# Patient Record
Sex: Female | Born: 1965 | Race: White | Hispanic: No | Marital: Single | Smoking: Current every day smoker
Health system: Southern US, Community
[De-identification: ages and names within clinical notes are randomized; demographics above are authoritative.]

## PROBLEM LIST (undated history)

## (undated) ENCOUNTER — Emergency Department (HOSPITAL_COMMUNITY): Payer: BC Managed Care – PPO

## (undated) HISTORY — PX: CHOLECYSTECTOMY: SHX55

---

## 1998-08-23 ENCOUNTER — Ambulatory Visit (HOSPITAL_COMMUNITY): Admission: RE | Admit: 1998-08-23 | Discharge: 1998-08-24 | Payer: Self-pay | Admitting: *Deleted

## 2001-05-11 ENCOUNTER — Other Ambulatory Visit: Admission: RE | Admit: 2001-05-11 | Discharge: 2001-05-11 | Payer: Self-pay | Admitting: Family Medicine

## 2014-10-20 ENCOUNTER — Emergency Department (INDEPENDENT_AMBULATORY_CARE_PROVIDER_SITE_OTHER): Payer: BC Managed Care – PPO

## 2014-10-20 ENCOUNTER — Encounter (HOSPITAL_COMMUNITY): Payer: Self-pay | Admitting: *Deleted

## 2014-10-20 ENCOUNTER — Emergency Department (HOSPITAL_COMMUNITY)
Admission: EM | Admit: 2014-10-20 | Discharge: 2014-10-20 | Disposition: A | Discharge status: Home / Self Care | Payer: BC Managed Care – PPO | Source: Home / Self Care | Attending: Family Medicine | Admitting: Family Medicine

## 2014-10-20 ENCOUNTER — Emergency Department (HOSPITAL_COMMUNITY)
Admission: EM | Admit: 2014-10-20 | Discharge: 2014-10-20 | Disposition: A | Discharge status: Home / Self Care | Payer: BC Managed Care – PPO | Attending: Emergency Medicine | Admitting: Emergency Medicine

## 2014-10-20 DIAGNOSIS — L03116 Cellulitis of left lower limb: Secondary | ICD-10-CM

## 2014-10-20 DIAGNOSIS — L089 Local infection of the skin and subcutaneous tissue, unspecified: Secondary | ICD-10-CM

## 2014-10-20 DIAGNOSIS — R000 Tachycardia, unspecified: Secondary | ICD-10-CM | POA: Diagnosis not present

## 2014-10-20 DIAGNOSIS — Z720 Tobacco use: Secondary | ICD-10-CM | POA: Diagnosis not present

## 2014-10-20 DIAGNOSIS — M79662 Pain in left lower leg: Secondary | ICD-10-CM | POA: Diagnosis present

## 2014-10-20 LAB — I-STAT CHEM 8, ED
BUN: 9 mg/dL (ref 6–23)
CHLORIDE: 103 meq/L (ref 96–112)
Calcium, Ion: 1.14 mmol/L (ref 1.12–1.23)
Creatinine, Ser: 0.7 mg/dL (ref 0.50–1.10)
Glucose, Bld: 98 mg/dL (ref 70–99)
HEMATOCRIT: 50 % — AB (ref 36.0–46.0)
Hemoglobin: 17 g/dL — ABNORMAL HIGH (ref 12.0–15.0)
POTASSIUM: 3.9 meq/L (ref 3.7–5.3)
Sodium: 139 mEq/L (ref 137–147)
TCO2: 24 mmol/L (ref 0–100)

## 2014-10-20 LAB — CBC WITH DIFFERENTIAL/PLATELET
BASOS ABS: 0 10*3/uL (ref 0.0–0.1)
BASOS PCT: 0 % (ref 0–1)
Eosinophils Absolute: 0 10*3/uL (ref 0.0–0.7)
Eosinophils Relative: 0 % (ref 0–5)
HCT: 45.1 % (ref 36.0–46.0)
Hemoglobin: 15.1 g/dL — ABNORMAL HIGH (ref 12.0–15.0)
Lymphocytes Relative: 14 % (ref 12–46)
Lymphs Abs: 1.8 10*3/uL (ref 0.7–4.0)
MCH: 29.8 pg (ref 26.0–34.0)
MCHC: 33.5 g/dL (ref 30.0–36.0)
MCV: 89 fL (ref 78.0–100.0)
Monocytes Absolute: 0.9 10*3/uL (ref 0.1–1.0)
Monocytes Relative: 7 % (ref 3–12)
NEUTROS ABS: 9.8 10*3/uL — AB (ref 1.7–7.7)
NEUTROS PCT: 79 % — AB (ref 43–77)
PLATELETS: 355 10*3/uL (ref 150–400)
RBC: 5.07 MIL/uL (ref 3.87–5.11)
RDW: 12.7 % (ref 11.5–15.5)
WBC: 12.5 10*3/uL — ABNORMAL HIGH (ref 4.0–10.5)

## 2014-10-20 LAB — I-STAT CG4 LACTIC ACID, ED: LACTIC ACID, VENOUS: 1.96 mmol/L (ref 0.5–2.2)

## 2014-10-20 MED ORDER — MORPHINE SULFATE 4 MG/ML IJ SOLN
4.0000 mg | Freq: Once | INTRAMUSCULAR | Status: AC
  Administered 2014-10-20: 4 mg via INTRAVENOUS
  Filled 2014-10-20: qty 1

## 2014-10-20 MED ORDER — CEPHALEXIN 500 MG PO CAPS: 1000.0000 mg | ORAL_CAPSULE | Freq: Two times a day (BID) | ORAL | Status: AC

## 2014-10-20 MED ORDER — SULFAMETHOXAZOLE-TRIMETHOPRIM 800-160 MG PO TABS: 1.0000 | ORAL_TABLET | Freq: Two times a day (BID) | ORAL | Status: AC

## 2014-10-20 MED ORDER — CEFTRIAXONE SODIUM 1 G IJ SOLR
1.0000 g | Freq: Once | INTRAMUSCULAR | Status: AC
  Administered 2014-10-20: 1 g via INTRAMUSCULAR

## 2014-10-20 MED ORDER — HYDROCODONE-ACETAMINOPHEN 5-325 MG PO TABS: 1.0000 | ORAL_TABLET | Freq: Four times a day (QID) | ORAL | Status: DC | PRN

## 2014-10-20 MED ORDER — CEFTRIAXONE SODIUM 1 G IJ SOLR
INTRAMUSCULAR | Status: AC
  Filled 2014-10-20: qty 10

## 2014-10-20 MED ORDER — LIDOCAINE HCL (PF) 1 % IJ SOLN
INTRAMUSCULAR | Status: AC
  Filled 2014-10-20: qty 5

## 2014-10-20 NOTE — ED Notes (Signed)
Pt states woke up at 0200 this morning to use restroom and noticed severe left lower leg pain.  Left anterior lower leg red with few bug-bite-appearing spots.  Denies numbness/tingling, but c/o severe pain.  Has tried warm soaks and elevation.

## 2014-10-20 NOTE — Discharge Instructions (Signed)
Take antibiotics as prescribed for the full duration. Eat yogurt to decrease risk of having diarrhea, take pain medication as needed for pain. Follow-up with your primary care provider in 24-48 hours for a wound recheck. Return to the ER if you noticed that your symptoms are progressively worsened despite treatment.  Cellulitis Cellulitis is an infection of the skin and the tissue beneath it. The infected area is usually red and tender. Cellulitis occurs most often in the arms and lower legs.  CAUSES  Cellulitis is caused by bacteria that enter the skin through cracks or cuts in the skin. The most common types of bacteria that cause cellulitis are staphylococci and streptococci. SIGNS AND SYMPTOMS   Redness and warmth.  Swelling.  Tenderness or pain.  Fever. DIAGNOSIS  Your health care provider can usually determine what is wrong based on a physical exam. Blood tests may also be done. TREATMENT  Treatment usually involves taking an antibiotic medicine. HOME CARE INSTRUCTIONS   Take your antibiotic medicine as directed by your health care provider. Finish the antibiotic even if you start to feel better.  Keep the infected arm or leg elevated to reduce swelling.  Apply a warm cloth to the affected area up to 4 times per day to relieve pain.  Take medicines only as directed by your health care provider.  Keep all follow-up visits as directed by your health care provider. SEEK MEDICAL CARE IF:   You notice red streaks coming from the infected area.  Your red area gets larger or turns dark in color.  Your bone or joint underneath the infected area becomes painful after the skin has healed.  Your infection returns in the same area or another area.  You notice a swollen bump in the infected area.  You develop new symptoms.  You have a fever. SEEK IMMEDIATE MEDICAL CARE IF:   You feel very sleepy.  You develop vomiting or diarrhea.  You have a general ill feeling (malaise)  with muscle aches and pains. MAKE SURE YOU:   Understand these instructions.  Will watch your condition.  Will get help right away if you are not doing well or get worse. Document Released: 09/09/2005 Document Revised: 04/16/2014 Document Reviewed: 02/15/2012 [facility] Patient Information 2015 [place]. This information is not intended to replace advice given to you by your health care provider. Make sure you discuss any questions you have with your health care provider.

## 2014-10-20 NOTE — ED Notes (Signed)
Report called to Kennith Centerracey, ED Charge RN.

## 2014-10-20 NOTE — ED Provider Notes (Signed)
CSN: [id number]     Arrival date & time 10/20/14  1003 History   First MD Initiated Contact with Patient 10/20/14 1031     Chief Complaint  Patient presents with  . Cellulitis   (Consider location/radiation/quality/duration/timing/severity/associated sxs/prior Treatment) HPI Comments: Patient reports she woke in the middle of the night last night about 2am with moderate to severe LLE pain. When she got up to use the bathroom she noticed 3 small red spots that she thought might be insect bites but could not recall how they might have occurred. States that when she went to bed the night before, she felt well and in her usual state of good health. No recent illness or injury.  When she woke again this morning, left lower leg was very painful particularly with weight bearing, she had generalized malaise and noticed that area of redness at anterior lower leg had spread to the size of a silver dollar. She continued to observe condition at home and over the past 3 hours, area of redness and discomfort has spread rapidly and now extends approximately 2/3 the anterior surface of her left lower leg and remains very painful. She reports that she feels fatigued and unwell.  Denies previous episodes.  Denies significant PMHx. No hx of DM  Patient is a 48 y.o. female presenting with rash. The history is provided by the patient.  Rash   History reviewed. No pertinent past medical history. Past Surgical History  Procedure Laterality Date  . Cholecystectomy     History reviewed. No pertinent family history. History  Substance Use Topics  . Smoking status: Current Every Day Smoker -- 1.00 packs/day    Types: Cigarettes  . Smokeless tobacco: Not on file  . Alcohol Use: No   OB History    No data available     Review of Systems  Skin: Positive for rash.  All other systems reviewed and are negative.   Allergies  Bee venom  Home Medications   Prior to Admission medications   Medication Sig  Start Date End Date Taking? Authorizing Provider  EPINEPHrine 0.3 mg/0.3 mL IJ SOAJ injection Inject into the muscle once. Prn for bee sting   Yes Historical Provider, MD   BP 143/95 mmHg  Pulse 107  Temp(Src) 100 F (37.8 C) (Oral)  Resp 28  SpO2 97%  LMP 10/16/2014 (Exact Date) Physical Exam  Constitutional: She is oriented to person, place, and time. She appears well-developed and well-nourished. No distress.  HENT:  Head: Normocephalic and atraumatic.  Eyes: Conjunctivae are normal. No scleral icterus.  Cardiovascular: Regular rhythm and normal heart sounds.   +mild tachycardia  Pulmonary/Chest: Effort normal and breath sounds normal.  Musculoskeletal: Normal range of motion. She exhibits tenderness.       Left lower leg: She exhibits tenderness and swelling.       Legs: CSM of left foot intact. No palpable SQ emphysema under area of induration. No areas of palpable fluctuance.  Neurological: She is alert and oriented to person, place, and time.  Skin: Skin is warm and dry. Rash noted. There is erythema.  Psychiatric: She has a normal mood and affect. Her behavior is normal.  Nursing note and vitals reviewed.   ED Course  Procedures (including critical care time) Labs Review Labs Reviewed - No data to display  Imaging Review Dg Tibia/fibula Left  10/20/2014   CLINICAL DATA:  Skin infection.  EXAM: LEFT TIBIA AND FIBULA - 2 VIEW  COMPARISON:  None.  FINDINGS:  Diffuse subcutaneous reticulation correlating with history of cellulitis. No subcutaneous gas or metallic foreign body. No evidence of osseous infection. No fracture.  IMPRESSION: No subcutaneous gas or acute osseous finding.   Electronically Signed   By: Tiburcio PeaJonathan  Watts M.D.   On: 10/20/2014 11:07     MDM   1. Cellulitis of left leg   2. Skin infection   I explained to patient and family my concerns about how rapidly her condition has progressed. While there is no visible SQ gas on plain radiographs, I still feel  that this patient would benefit from additional evaluation and lab work not available at this facility. [facility] I called to notify ER attending of patient. Patient given 1000 mg of IM Rocephin prior to discharge. Patient's husband states that [facility]    Ria ClockJennifer Lee H Mercado, [place] 10/20/14 1136

## 2014-10-20 NOTE — Discharge Instructions (Signed)
[facility]

## 2014-10-20 NOTE — ED Provider Notes (Signed)
CSN: [id number]     Arrival date & time 10/20/14  1120 History   First MD Initiated Contact with Patient 10/20/14 1142     Chief Complaint  Patient presents with  . Cellulitis     (Consider location/radiation/quality/duration/timing/severity/associated sxs/prior Treatment) HPI  48 year old female without significant past medical history, past surgical history include cholecystectomy who presents for evaluation of left lower extremity pain.patient states she has a normal day yesterday without any changes in her normal routine. This morning she got up to use the bathroom around 3:00am and noticed pain to her left lower extremities. Pain got progressively worse with associated redness. She woke up this morning and noticed that her left lower leg is very painful worsening with weightbearing and with palpation. Patient also mentioned "I just don't feel right" including having general malaise. She noticed the redness and discomfort has spread rapidly in her left lower leg. She went to urgent care for this complaint and was recommended to come to the ER for further evaluation. She did receive a shot of Rocephin prior to arrival. Patient does not have any significant medical history including no history of diabetes, denies any recent work travel, insect bite, or any other trauma. No change in drugs, pets, soap or detergent.    History reviewed. No pertinent past medical history. Past Surgical History  Procedure Laterality Date  . Cholecystectomy     History reviewed. No pertinent family history. History  Substance Use Topics  . Smoking status: Current Every Day Smoker -- 1.00 packs/day    Types: Cigarettes  . Smokeless tobacco: Not on file  . Alcohol Use: No   OB History    No data available     Review of Systems  All other systems reviewed and are negative.     Allergies  Bee venom  Home Medications   Prior to Admission medications   Medication Sig Start Date End Date Taking?  Authorizing Provider  EPINEPHrine 0.3 mg/0.3 mL IJ SOAJ injection Inject into the muscle once. Prn for bee sting    Historical Provider, MD   BP 158/100 mmHg  Pulse 100  Temp(Src) 99.1 F (37.3 C)  Resp 16  Ht 5\' 1"  (1.549 m)  Wt 200 lb (90.719 kg)  BMI 37.81 kg/m2  SpO2 99%  LMP 10/16/2014 (Exact Date) Physical Exam  Constitutional: She is oriented to person, place, and time. She appears well-developed and well-nourished. No distress.  HENT:  Head: Atraumatic.  Mouth/Throat: Oropharynx is clear and moist.  Eyes: Conjunctivae are normal.  Neck: Neck supple.  Cardiovascular: Intact distal pulses.   Mild tachycardia without murmurs rubs or gallops  Pulmonary/Chest: Effort normal and breath sounds normal. No respiratory distress. She has no wheezes.  Abdominal: Soft. There is no tenderness.  Musculoskeletal: She exhibits tenderness (left lower extremities with a moderately size area of erythema, warmth and tenderness to the anterior tib-fib consistent with cellulitis.  No emphysema, no necrosis, no pus).  Normal strength and sensation to all 4 extremities, intact pedal pulses. No joint involvement.  Neurological: She is alert and oriented to person, place, and time.  Skin: No rash noted.  Psychiatric: She has a normal mood and affect.  Nursing note and vitals reviewed.   ED Course  Procedures (including critical care time)  12:14 PM Patient with rapid onset of left lower extremity cellulitis which was sent here from urgent care for further management. Patient did received 1 mg IM Rocephin prior to arrival. The infection is located to the  anterior leg, non-circumferential, no evidence of compartment syndrome. The infection does not appears to be necrotizing fasciitis. Blood work initiated, IV fluid given, will monitor closely.care discussed with Dr. Littie DeedsGentry.  1:54 PM Patient has been monitored for the past several hours without any worsening of symptoms. She maintained stable normal  vital signs. She is resting comfortably. mildly elevated WBC of 12.5, normal lactic acid and normal electrolytes. At this time we felt the patient is stable for discharge with treatment of by mouth antibiotic including Keflex and Bactrim. Pain medication prescribed. Patient will need to return in 24-48 hours for wound recheck if symptoms worsen. Otherwise she can follow-up with her PCP for further management.  Labs Review Labs Reviewed  CBC WITH DIFFERENTIAL - Abnormal; Notable for the following:    WBC 12.5 (*)    Hemoglobin 15.1 (*)    Neutrophils Relative % 79 (*)    Neutro Abs 9.8 (*)    All other components within normal limits  I-STAT CHEM 8, ED - Abnormal; Notable for the following:    Hemoglobin 17.0 (*)    HCT 50.0 (*)    All other components within normal limits  I-STAT CG4 LACTIC ACID, ED    Imaging Review Dg Tibia/fibula Left  10/20/2014   CLINICAL DATA:  Skin infection.  EXAM: LEFT TIBIA AND FIBULA - 2 VIEW  COMPARISON:  None.  FINDINGS: Diffuse subcutaneous reticulation correlating with history of cellulitis. No subcutaneous gas or metallic foreign body. No evidence of osseous infection. No fracture.  IMPRESSION: No subcutaneous gas or acute osseous finding.   Electronically Signed   By: Tiburcio PeaJonathan  Watts M.D.   On: 10/20/2014 11:07     EKG Interpretation None      MDM   Final diagnoses:  Cellulitis of left anterior lower leg    BP 138/77 mmHg  Pulse 65  Temp(Src) 99.1 F (37.3 C)  Resp 18  Ht 5\' 1"  (1.549 m)  Wt 200 lb (90.719 kg)  BMI 37.81 kg/m2  SpO2 96%  LMP 10/16/2014 (Exact Date)  I have reviewed nursing notes and vital signs. I personally reviewed the imaging tests through PACS system  I reviewed available ER/hospitalization records thought the EMR     Fayrene HelperBowie Marlina Cataldi, PA-C 10/20/14 1401  Mirian MoMatthew Gentry, MD 10/21/14 [phone]

## 2014-10-20 NOTE — ED Notes (Signed)
Pt comfortable with discharge and follow up instructions. Prescriptions x3. Pt declines wheelchair, escorted to waiting area.

## 2014-10-20 NOTE — ED Notes (Addendum)
Pt reports left lower leg pain that started last night and having generalized fatigue. Pt went to [facility] and sent here due to redness lower anterior leg, skin marked pta. Pt had xray done and rocephin IM ing at [facility].

## 2019-07-03 ENCOUNTER — Emergency Department (HOSPITAL_COMMUNITY)
Admission: EM | Admit: 2019-07-03 | Discharge: 2019-07-03 | Disposition: A | Discharge status: Home / Self Care | Payer: BC Managed Care – PPO | Attending: Emergency Medicine | Admitting: Emergency Medicine

## 2019-07-03 ENCOUNTER — Emergency Department (HOSPITAL_COMMUNITY): Payer: BC Managed Care – PPO

## 2019-07-03 ENCOUNTER — Other Ambulatory Visit: Payer: Self-pay

## 2019-07-03 DIAGNOSIS — S40022A Contusion of left upper arm, initial encounter: Secondary | ICD-10-CM | POA: Diagnosis not present

## 2019-07-03 DIAGNOSIS — F17210 Nicotine dependence, cigarettes, uncomplicated: Secondary | ICD-10-CM | POA: Diagnosis not present

## 2019-07-03 DIAGNOSIS — Z03818 Encounter for observation for suspected exposure to other biological agents ruled out: Secondary | ICD-10-CM | POA: Insufficient documentation

## 2019-07-03 DIAGNOSIS — M545 Low back pain, unspecified: Secondary | ICD-10-CM

## 2019-07-03 DIAGNOSIS — S1081XA Abrasion of other specified part of neck, initial encounter: Secondary | ICD-10-CM | POA: Diagnosis not present

## 2019-07-03 DIAGNOSIS — T07XXXA Unspecified multiple injuries, initial encounter: Secondary | ICD-10-CM | POA: Diagnosis present

## 2019-07-03 DIAGNOSIS — S7011XA Contusion of right thigh, initial encounter: Secondary | ICD-10-CM | POA: Diagnosis not present

## 2019-07-03 DIAGNOSIS — R10817 Generalized abdominal tenderness: Secondary | ICD-10-CM | POA: Diagnosis not present

## 2019-07-03 DIAGNOSIS — R0789 Other chest pain: Secondary | ICD-10-CM | POA: Insufficient documentation

## 2019-07-03 DIAGNOSIS — Y939 Activity, unspecified: Secondary | ICD-10-CM | POA: Diagnosis not present

## 2019-07-03 DIAGNOSIS — S5012XA Contusion of left forearm, initial encounter: Secondary | ICD-10-CM | POA: Insufficient documentation

## 2019-07-03 DIAGNOSIS — Y929 Unspecified place or not applicable: Secondary | ICD-10-CM | POA: Diagnosis not present

## 2019-07-03 DIAGNOSIS — Y999 Unspecified external cause status: Secondary | ICD-10-CM | POA: Diagnosis not present

## 2019-07-03 LAB — CBC
HCT: 45 % (ref 36.0–46.0)
Hemoglobin: 15 g/dL (ref 12.0–15.0)
MCH: 30.3 pg (ref 26.0–34.0)
MCHC: 33.3 g/dL (ref 30.0–36.0)
MCV: 90.9 fL (ref 80.0–100.0)
Platelets: 357 10*3/uL (ref 150–400)
RBC: 4.95 MIL/uL (ref 3.87–5.11)
RDW: 13.1 % (ref 11.5–15.5)
WBC: 10.4 10*3/uL (ref 4.0–10.5)
nRBC: 0 % (ref 0.0–0.2)

## 2019-07-03 LAB — SAMPLE TO BLOOD BANK

## 2019-07-03 LAB — URINALYSIS, ROUTINE W REFLEX MICROSCOPIC
Bacteria, UA: NONE SEEN
Bilirubin Urine: NEGATIVE
Glucose, UA: NEGATIVE mg/dL
Ketones, ur: NEGATIVE mg/dL
Leukocytes,Ua: NEGATIVE
Nitrite: NEGATIVE
Protein, ur: NEGATIVE mg/dL
Specific Gravity, Urine: 1.015 (ref 1.005–1.030)
pH: 5 (ref 5.0–8.0)

## 2019-07-03 LAB — COMPREHENSIVE METABOLIC PANEL
ALT: 26 U/L (ref 0–44)
AST: 20 U/L (ref 15–41)
Albumin: 3.7 g/dL (ref 3.5–5.0)
Alkaline Phosphatase: 59 U/L (ref 38–126)
Anion gap: 11 (ref 5–15)
BUN: 12 mg/dL (ref 6–20)
CO2: 23 mmol/L (ref 22–32)
Calcium: 8.9 mg/dL (ref 8.9–10.3)
Chloride: 106 mmol/L (ref 98–111)
Creatinine, Ser: 0.83 mg/dL (ref 0.44–1.00)
GFR calc Af Amer: >60 mL/min (ref >60)
GFR calc non Af Amer: >60 mL/min (ref >60)
Glucose, Bld: 110 mg/dL — ABNORMAL HIGH (ref 70–99)
Potassium: 4.1 mmol/L (ref 3.5–5.1)
Sodium: 140 mmol/L (ref 135–145)
Total Bilirubin: 0.6 mg/dL (ref 0.3–1.2)
Total Protein: 6.4 g/dL — ABNORMAL LOW (ref 6.5–8.1)

## 2019-07-03 LAB — CDS SEROLOGY

## 2019-07-03 LAB — I-STAT CHEM 8, ED
BUN: 13 mg/dL (ref 6–20)
Calcium, Ion: 1.16 mmol/L (ref 1.15–1.40)
Chloride: 107 mmol/L (ref 98–111)
Creatinine, Ser: 0.7 mg/dL (ref 0.44–1.00)
Glucose, Bld: 110 mg/dL — ABNORMAL HIGH (ref 70–99)
HCT: 45 % (ref 36.0–46.0)
Hemoglobin: 15.3 g/dL — ABNORMAL HIGH (ref 12.0–15.0)
Potassium: 4 mmol/L (ref 3.5–5.1)
Sodium: 140 mmol/L (ref 135–145)
TCO2: 26 mmol/L (ref 22–32)

## 2019-07-03 LAB — PROTIME-INR
INR: 1 (ref 0.8–1.2)
Prothrombin Time: 12.7 seconds (ref 11.4–15.2)

## 2019-07-03 LAB — LACTIC ACID, PLASMA: Lactic Acid, Venous: 1.6 mmol/L (ref 0.5–1.9)

## 2019-07-03 LAB — SARS CORONAVIRUS 2 BY RT PCR (HOSPITAL ORDER, PERFORMED IN ~~LOC~~ HOSPITAL LAB): SARS Coronavirus 2: NEGATIVE

## 2019-07-03 MED ORDER — FENTANYL CITRATE (PF) 100 MCG/2ML IJ SOLN
50.0000 ug | Freq: Once | INTRAMUSCULAR | Status: AC
  Administered 2019-07-03: 13:00:00 50 ug via INTRAVENOUS
  Filled 2019-07-03: qty 2

## 2019-07-03 MED ORDER — FENTANYL CITRATE (PF) 100 MCG/2ML IJ SOLN
50.0000 ug | Freq: Once | INTRAMUSCULAR | Status: AC
  Administered 2019-07-03: 50 ug via INTRAVENOUS
  Filled 2019-07-03: qty 2

## 2019-07-03 MED ORDER — SODIUM CHLORIDE 0.9 % IV BOLUS
125.0000 mL | Freq: Once | INTRAVENOUS | Status: AC
  Administered 2019-07-03: 1000 mL via INTRAVENOUS

## 2019-07-03 MED ORDER — CYCLOBENZAPRINE HCL 10 MG PO TABS: 5.0000 mg | ORAL_TABLET | Freq: Two times a day (BID) | ORAL | 0 refills | Status: DC | PRN

## 2019-07-03 MED ORDER — HYDROCODONE-ACETAMINOPHEN 5-325 MG PO TABS: 1.0000 | ORAL_TABLET | Freq: Four times a day (QID) | ORAL | 0 refills | Status: AC | PRN

## 2019-07-03 MED ORDER — ONDANSETRON HCL 4 MG/2ML IJ SOLN
4.0000 mg | Freq: Once | INTRAMUSCULAR | Status: AC
  Administered 2019-07-03: 10:00:00 4 mg via INTRAVENOUS
  Filled 2019-07-03: qty 2

## 2019-07-03 MED ORDER — IOHEXOL 300 MG/ML  SOLN
100.0000 mL | Freq: Once | INTRAMUSCULAR | Status: AC | PRN
  Administered 2019-07-03: 12:00:00 100 mL via INTRAVENOUS

## 2019-07-03 MED ORDER — ONDANSETRON HCL 4 MG PO TABS: 4.0000 mg | ORAL_TABLET | Freq: Three times a day (TID) | ORAL | 0 refills | Status: AC | PRN

## 2019-07-03 MED ORDER — HYDROCODONE-ACETAMINOPHEN 5-325 MG PO TABS: 1.0000 | ORAL_TABLET | Freq: Four times a day (QID) | ORAL | 0 refills | Status: DC | PRN

## 2019-07-03 MED ORDER — CYCLOBENZAPRINE HCL 10 MG PO TABS: 5.0000 mg | ORAL_TABLET | Freq: Two times a day (BID) | ORAL | 0 refills | Status: AC | PRN
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
## 2019-07-03 NOTE — Discharge Instructions (Signed)
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
## 2019-07-03 NOTE — ED Notes (Signed)
Pt placed on bedpan

## 2019-07-03 NOTE — ED Notes (Signed)
c-collar placed and log rolled

## 2019-07-05 ENCOUNTER — Other Ambulatory Visit: Payer: Self-pay

## 2019-07-05 ENCOUNTER — Encounter (HOSPITAL_COMMUNITY): Payer: Self-pay

## 2019-07-05 ENCOUNTER — Emergency Department (HOSPITAL_COMMUNITY)
Admission: EM | Admit: 2019-07-05 | Discharge: 2019-07-06 | Disposition: A | Discharge status: Home / Self Care | Payer: BC Managed Care – PPO | Attending: Emergency Medicine | Admitting: Emergency Medicine

## 2019-07-05 DIAGNOSIS — R42 Dizziness and giddiness: Secondary | ICD-10-CM | POA: Diagnosis not present

## 2019-07-05 DIAGNOSIS — R110 Nausea: Secondary | ICD-10-CM | POA: Diagnosis not present

## 2019-07-05 DIAGNOSIS — Z79899 Other long term (current) drug therapy: Secondary | ICD-10-CM | POA: Diagnosis not present

## 2019-07-05 DIAGNOSIS — R51 Headache: Secondary | ICD-10-CM | POA: Insufficient documentation

## 2019-07-05 DIAGNOSIS — F17210 Nicotine dependence, cigarettes, uncomplicated: Secondary | ICD-10-CM | POA: Insufficient documentation

## 2019-07-05 DIAGNOSIS — R519 Headache, unspecified: Secondary | ICD-10-CM

## 2019-07-05 MED ORDER — SODIUM CHLORIDE 0.9% FLUSH: 3.0000 mL | Freq: Once | INTRAVENOUS | Status: DC

## 2019-07-06 LAB — URINALYSIS, ROUTINE W REFLEX MICROSCOPIC
Bilirubin Urine: NEGATIVE
Glucose, UA: NEGATIVE mg/dL
Ketones, ur: NEGATIVE mg/dL
Leukocytes,Ua: NEGATIVE
Nitrite: NEGATIVE
Protein, ur: NEGATIVE mg/dL
Specific Gravity, Urine: 1.003 — ABNORMAL LOW (ref 1.005–1.030)
pH: 6 (ref 5.0–8.0)

## 2019-07-06 LAB — I-STAT BETA HCG BLOOD, ED (MC, WL, AP ONLY): I-stat hCG, quantitative: <5 m[IU]/mL (ref <5)

## 2019-07-06 LAB — CBC
HCT: 45.6 % (ref 36.0–46.0)
Hemoglobin: 14.7 g/dL (ref 12.0–15.0)
MCH: 30 pg (ref 26.0–34.0)
MCHC: 32.2 g/dL (ref 30.0–36.0)
MCV: 93.1 fL (ref 80.0–100.0)
Platelets: 391 10*3/uL (ref 150–400)
RBC: 4.9 MIL/uL (ref 3.87–5.11)
RDW: 13.3 % (ref 11.5–15.5)
WBC: 10.7 10*3/uL — ABNORMAL HIGH (ref 4.0–10.5)
nRBC: 0 % (ref 0.0–0.2)

## 2019-07-06 LAB — BASIC METABOLIC PANEL
Anion gap: 8 (ref 5–15)
BUN: 10 mg/dL (ref 6–20)
CO2: 28 mmol/L (ref 22–32)
Calcium: 9.3 mg/dL (ref 8.9–10.3)
Chloride: 106 mmol/L (ref 98–111)
Creatinine, Ser: 0.86 mg/dL (ref 0.44–1.00)
GFR calc Af Amer: >60 mL/min (ref >60)
GFR calc non Af Amer: >60 mL/min (ref >60)
Glucose, Bld: 112 mg/dL — ABNORMAL HIGH (ref 70–99)
Potassium: 3.8 mmol/L (ref 3.5–5.1)
Sodium: 142 mmol/L (ref 135–145)

## 2019-07-06 MED ORDER — DEXAMETHASONE SODIUM PHOSPHATE 10 MG/ML IJ SOLN
10.0000 mg | Freq: Once | INTRAMUSCULAR | Status: AC
  Administered 2019-07-06: 10 mg via INTRAVENOUS
  Filled 2019-07-06: qty 1

## 2019-07-06 MED ORDER — SODIUM CHLORIDE 0.9 % IV BOLUS
500.0000 mL | Freq: Once | INTRAVENOUS | Status: AC
  Administered 2019-07-06: 500 mL via INTRAVENOUS

## 2019-07-06 MED ORDER — METOCLOPRAMIDE HCL 5 MG/ML IJ SOLN
10.0000 mg | INTRAMUSCULAR | Status: AC
  Administered 2019-07-06: 03:00:00 10 mg via INTRAVENOUS
  Filled 2019-07-06: qty 2

## 2019-07-06 MED ORDER — MECLIZINE HCL 25 MG PO TABS
25.0000 mg | ORAL_TABLET | Freq: Once | ORAL | Status: AC
  Administered 2019-07-06: 25 mg via ORAL
  Filled 2019-07-06: qty 1

## 2019-07-06 MED ORDER — METOCLOPRAMIDE HCL 10 MG PO TABS: 10.0000 mg | ORAL_TABLET | Freq: Four times a day (QID) | ORAL | 0 refills | Status: AC | PRN

## 2019-07-06 MED ORDER — MECLIZINE HCL 25 MG PO TABS: 25.0000 mg | ORAL_TABLET | Freq: Three times a day (TID) | ORAL | 0 refills | Status: AC | PRN

## 2019-07-06 MED ORDER — KETOROLAC TROMETHAMINE 30 MG/ML IJ SOLN
15.0000 mg | Freq: Once | INTRAMUSCULAR | Status: AC
  Administered 2019-07-06: 15 mg via INTRAVENOUS
  Filled 2019-07-06: qty 1
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
## 2019-07-06 NOTE — ED Notes (Signed)
Pt. Requesting that we text her once it's her turn so she can go sit in the car with her husband. This tech told her that we could not text her but she could go out for a moment and come back before we got to her name. She stated she called the nurse before coming and that it would only be an hour or less. Pt. Said she no longer wanted to wait and would be leaving. Pulling pt. OTF.

## 2019-07-06 NOTE — ED Notes (Signed)
Charge RN spoke with pt and she does still want to be seen.
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
